# Patient Record
Sex: Female | Born: 1945 | Race: Black or African American | Hispanic: No | Marital: Married | Smoking: Current every day smoker
Health system: Southern US, Community
[De-identification: ages and names within clinical notes are randomized; demographics above are authoritative.]

## PROBLEM LIST (undated history)

## (undated) DIAGNOSIS — E876 Hypokalemia: Secondary | ICD-10-CM

## (undated) DIAGNOSIS — R739 Hyperglycemia, unspecified: Secondary | ICD-10-CM

## (undated) DIAGNOSIS — Z720 Tobacco use: Secondary | ICD-10-CM

## (undated) DIAGNOSIS — E039 Hypothyroidism, unspecified: Secondary | ICD-10-CM

## (undated) DIAGNOSIS — I10 Essential (primary) hypertension: Secondary | ICD-10-CM

## (undated) DIAGNOSIS — Z8679 Personal history of other diseases of the circulatory system: Secondary | ICD-10-CM

## (undated) DIAGNOSIS — J449 Chronic obstructive pulmonary disease, unspecified: Secondary | ICD-10-CM

## (undated) DIAGNOSIS — E785 Hyperlipidemia, unspecified: Secondary | ICD-10-CM

## (undated) HISTORY — PX: ABDOMINAL HYSTERECTOMY: SHX81

## (undated) HISTORY — PX: COLONOSCOPY: SHX174

---

## 2004-06-13 ENCOUNTER — Ambulatory Visit: Payer: Self-pay | Admitting: Internal Medicine

## 2005-07-24 ENCOUNTER — Ambulatory Visit: Payer: Self-pay | Admitting: Internal Medicine

## 2006-07-28 ENCOUNTER — Ambulatory Visit: Payer: Self-pay | Admitting: Internal Medicine

## 2007-09-01 ENCOUNTER — Ambulatory Visit: Payer: Self-pay | Admitting: Internal Medicine

## 2008-05-01 ENCOUNTER — Ambulatory Visit: Payer: Self-pay | Admitting: Gastroenterology

## 2008-09-13 ENCOUNTER — Ambulatory Visit: Payer: Self-pay | Admitting: Internal Medicine

## 2009-09-19 ENCOUNTER — Ambulatory Visit: Payer: Self-pay | Admitting: Internal Medicine

## 2010-11-26 ENCOUNTER — Ambulatory Visit: Payer: Self-pay | Admitting: Internal Medicine

## 2011-04-22 ENCOUNTER — Ambulatory Visit: Payer: Self-pay | Admitting: Internal Medicine

## 2011-12-17 ENCOUNTER — Ambulatory Visit: Payer: Self-pay | Admitting: Internal Medicine

## 2012-12-20 ENCOUNTER — Ambulatory Visit: Payer: Self-pay | Admitting: Internal Medicine

## 2013-05-22 ENCOUNTER — Inpatient Hospital Stay: Payer: Self-pay | Admitting: Internal Medicine

## 2013-05-22 LAB — CBC
HCT: 42.1 % (ref 35.0–47.0)
HGB: 14.2 g/dL (ref 12.0–16.0)
MCH: 29.4 pg (ref 26.0–34.0)
MCHC: 33.8 g/dL (ref 32.0–36.0)
MCV: 87 fL (ref 80–100)
PLATELETS: 188 10*3/uL (ref 150–440)
RBC: 4.84 10*6/uL (ref 3.80–5.20)
RDW: 15.1 % — ABNORMAL HIGH (ref 11.5–14.5)
WBC: 5.6 10*3/uL (ref 3.6–11.0)

## 2013-05-22 LAB — COMPREHENSIVE METABOLIC PANEL
ALBUMIN: 4.1 g/dL (ref 3.4–5.0)
ALT: 23 U/L (ref 12–78)
Alkaline Phosphatase: 108 U/L
Anion Gap: 5 — ABNORMAL LOW (ref 7–16)
BILIRUBIN TOTAL: 0.4 mg/dL (ref 0.2–1.0)
BUN: 9 mg/dL (ref 7–18)
Calcium, Total: 9.3 mg/dL (ref 8.5–10.1)
Chloride: 106 mmol/L (ref 98–107)
Co2: 32 mmol/L (ref 21–32)
Creatinine: 0.77 mg/dL (ref 0.60–1.30)
GLUCOSE: 138 mg/dL — AB (ref 65–99)
OSMOLALITY: 286 (ref 275–301)
Potassium: 2.9 mmol/L — ABNORMAL LOW (ref 3.5–5.1)
SGOT(AST): 15 U/L (ref 15–37)
Sodium: 143 mmol/L (ref 136–145)
Total Protein: 7.6 g/dL (ref 6.4–8.2)

## 2013-05-22 LAB — MAGNESIUM: Magnesium: 1.6 mg/dL — ABNORMAL LOW

## 2013-05-22 LAB — URINALYSIS, COMPLETE
BILIRUBIN, UR: NEGATIVE
Bacteria: NONE SEEN
Blood: NEGATIVE
Glucose,UR: NEGATIVE mg/dL (ref 0–75)
KETONE: NEGATIVE
Leukocyte Esterase: NEGATIVE
Nitrite: NEGATIVE
Ph: 7 (ref 4.5–8.0)
Protein: NEGATIVE
Specific Gravity: 1.004 (ref 1.003–1.030)

## 2013-05-22 LAB — CK TOTAL AND CKMB (NOT AT ARMC)
CK, Total: 94 U/L (ref 21–215)
CK, Total: 85 U/L (ref 21–215)
CK, Total: 86 U/L (ref 21–215)
CK-MB: 1.3 ng/mL (ref 0.5–3.6)
CK-MB: 2.3 ng/mL (ref 0.5–3.6)
CK-MB: 2.6 ng/mL (ref 0.5–3.6)

## 2013-05-22 LAB — TSH: Thyroid Stimulating Horm: 5.7 u[IU]/mL — ABNORMAL HIGH

## 2013-05-22 LAB — TROPONIN I
TROPONIN-I: 0.03 ng/mL
Troponin-I: 0.06 ng/mL — ABNORMAL HIGH
Troponin-I: 0.04 ng/mL

## 2013-05-22 LAB — LIPASE, BLOOD: Lipase: 215 U/L (ref 73–393)

## 2013-05-23 LAB — CBC WITH DIFFERENTIAL/PLATELET
BASOS PCT: 1.1 %
Basophil #: 0.1 10*3/uL (ref 0.0–0.1)
EOS ABS: 0.2 10*3/uL (ref 0.0–0.7)
Eosinophil %: 4.2 %
HCT: 37.4 % (ref 35.0–47.0)
HGB: 12.7 g/dL (ref 12.0–16.0)
LYMPHS ABS: 1.6 10*3/uL (ref 1.0–3.6)
Lymphocyte %: 30.7 %
MCH: 29.9 pg (ref 26.0–34.0)
MCHC: 33.9 g/dL (ref 32.0–36.0)
MCV: 88 fL (ref 80–100)
MONO ABS: 0.5 x10 3/mm (ref 0.2–0.9)
MONOS PCT: 9 %
NEUTROS PCT: 55 %
Neutrophil #: 2.8 10*3/uL (ref 1.4–6.5)
Platelet: 178 10*3/uL (ref 150–440)
RBC: 4.25 10*6/uL (ref 3.80–5.20)
RDW: 15.5 % — AB (ref 11.5–14.5)
WBC: 5.1 10*3/uL (ref 3.6–11.0)

## 2013-05-23 LAB — BASIC METABOLIC PANEL
ANION GAP: 4 — AB (ref 7–16)
BUN: 6 mg/dL — ABNORMAL LOW (ref 7–18)
Calcium, Total: 9 mg/dL (ref 8.5–10.1)
Chloride: 109 mmol/L — ABNORMAL HIGH (ref 98–107)
Co2: 24 mmol/L (ref 21–32)
Creatinine: 0.67 mg/dL (ref 0.60–1.30)
EGFR (African American): >60
GLUCOSE: 111 mg/dL — AB (ref 65–99)
OSMOLALITY: 272 (ref 275–301)
Potassium: 3.7 mmol/L (ref 3.5–5.1)
Sodium: 137 mmol/L (ref 136–145)

## 2013-05-23 LAB — MAGNESIUM: Magnesium: 2 mg/dL

## 2013-06-11 ENCOUNTER — Ambulatory Visit: Payer: Self-pay | Admitting: Physician Assistant

## 2014-01-05 ENCOUNTER — Ambulatory Visit: Payer: Self-pay | Admitting: Internal Medicine

## 2014-09-09 NOTE — Consult Note (Signed)
Brief Consult Note: Diagnosis: recurrent transient afib with rvr; hypokalemia and hypomagnasemia.   Patient was seen by consultant.   Recommend further assessment or treatment.   Comments: 69 yo female with history of hypertension and paroxysmal afib in the pastg admitted with afib with rvr. COnverted to nsr with iv cardizem. Had K of 2.9 and Mg of 1.6. Pt has had afib in the past when her K and Mg were low. She had a trivial troponin elevaton with her aifb. Denies chest pain. EKG does not suggest ischemia and is likley demand. Would repleat K and Mg to 4 and 2 respectively and continue with po cardizem. Transfer to floor and consider discharge when K and Mg are repleated and if rhythm remains in nsr. CHADSS scxore is 1. Would continue with asa only.  Electronic Signatures: Dalia HeadingFath, Johnanna Bakke A (MD)  (Signed 04-Jan-15 09:46)  Authored: Brief Consult Note   Last Updated: 04-Jan-15 09:46 by Dalia HeadingFath, Aarion Metzgar A (MD)

## 2014-09-09 NOTE — H&P (Signed)
PATIENT NAME:  Donna Figueroa, Donna Figueroa MR#:  [id number] DATE OF BIRTH:  1946/02/28  DATE OF ADMISSION:  05/22/2013  REFERRING PHYSICIAN: Eartha Inch. York Cerise, MD PRIMARY CARE PHYSICIAN: Dr. [name].   CARDIOLOGIST: None.   CHIEF COMPLAINT: Palpitations.   HISTORY OF PRESENT ILLNESS: This is a 69 year old female with significant past medical history of transient atrial fibrillation, COPD, tobacco abuse, hypothyroidism, hypertension, hyperlipidemia, who presents with complaints of palpitations. The patient reports she palpitations this a.m., which prompted her to come to the ED. She denies any chest pain, any shortness of breath, any fever, any chills, any cough, any dysuria or polyuria. Upon presentation the patient was found to be in atrial fibrillation with heart rate in the 150s. The patient was giving IV Cardizem x 2 with only heart rate minimally decreasing, so she was started on Cardizem drip. The patient's EKG did show atrial fibrillation with heart rate in 151. The patient had negative troponins. EKG did show her to be in atrial fibrillation with RVR at 151 beats per minute. The patient reports she   have history of atrial fibrillation in the past but reports the last episode was almost 10 years ago.   PAST MEDICAL HISTORY: 1. History of transient atrial fibrillation.  2. Chronic obstructive pulmonary disease.  3. Tobacco abuse.  4. Hypothyroidism.  5. Hypertension.  6. Hyperlipidemia.   PAST SURGICAL HISTORY:  Status post partial hysterectomy.   SOCIAL HISTORY: The patient smokes 1/2 pack per day. No history of alcohol use. Lives at home with her husband.   FAMILY HISTORY: Denies any family history of coronary artery disease.    HOME MEDICATIONS: 1. Synthroid 100 mcg oral daily.  2. Lipitor 40 mg at bedtime.  3. Klor-Con 20 mEq oral daily.  4.cardizem  extended release 240 mg oral daily.  5. Metoprolol 25 mg oral twice a day.  6. Aspirin 81 mg oral daily.   REVIEW OF SYSTEMS:   CONSTITUTIONAL: The patient denies fever, chills, fatigue, weakness, weight gain,, weight loss.  EYES: Denies blurry vision, double vision, inflammation, glaucoma.  ENT: Denies tinnitus, ear pain, hearing loss, epistaxis or discharge.  RESPIRATORY: Denies any cough, wheezing, hemoptysis, dyspnea, painful respiratory.  CARDIOVASCULAR: Denies any chest pain, orthopnea, edema, syncope. Reports palpitation.  GASTROINTESTINAL: Denies nausea, vomiting, diarrhea, abdominal pain, hematemesis.  GENITOURINARY: Denies dysuria, hematuria, renal colic.  ENDOCRINE: Denies polyuria, polydipsia, heat or cold intolerance.  HEMATOLOGY: Denies anemia, easy bruising, bleeding diathesis.  INTEGUMENTARY: Denies acne, rash or skin lesions.  MUSCULOSKELETAL: Denies any neck pain, back pain, swelling, gout or cramps. NEUROLOGICAL:  Denies CVA, TIA, headache, dementia, ataxia.  PSYCHIATRIC: Denies anxiety, insomnia or schizophrenia.   PHYSICAL EXAMINATION: VITAL SIGNS: Pulse 120, respiratory rate 20, blood pressure 102/75, saturating 97% on room air.  GENERAL: Well-nourished female, looks comfortable in bed in no apparent distress.  HEENT: Head atraumatic, normocephalic. Pupils equal, reactive to light. Pink conjunctivae. Anicteric sclerae. Moist oral mucosa.  NECK: Supple. No thyromegaly. No JVD. No carotid bruits.  CHEST: Good air entry bilaterally. No wheezing, rales, rhonchi.  CARDIOVASCULAR: S1, S2 heard. No rubs, murmur or gallops, irregular irregular, tachycardic.  ABDOMEN: Soft, nontender, nondistended. Bowel sounds present.  EXTREMITIES: No edema. No clubbing. No cyanosis. Pedal pulses +2 bilaterally.   PSYCHIATRIC: Appropriate affect. Awake, alert x 3. Intact judgment and insight.  NEUROLOGIC: Grossly intact. Motor five out of five. No focal deficits.  SKIN: Normal skin turgor. Warm and dry.  LYMPHATICS: No cervical lymphadenopathy could be appreciated.   PERTINENT  LABORATORY DATA: Glucose 138 ,  creatinine 0.77, sodium 143, potassium 2.9, chloride 106, CO2 32. ALT 23, AST 15, alkaline phosphatase 108. Troponin 0.03. White blood cell 5.6, hemoglobin 14.2, hematocrit 42.1, platelets 188. Urinalysis negative for leukocyte esterase. EKG showing atrial fibrillation and RVR with heart rate of 151.   ASSESSMENT AND PLAN: 1. Atrial fibrillation with rapid ventricular response. The patient is known to have history of paroxysmal atrial fibrillation. Heart rate was uncontrolled with IV Cardizem pushes, so she will need to be started on Cardizem drip and will be admitted to Critical Care Unit. Denies any chest pain, any shortness of breath, has negative troponins. We will continue to cycle her cardiac enzymes. We will check 2-D echo and will consult cardiology service. As well, continue her on her metoprolol and p.o. Cardizem. The patient is already on aspirin. We will check TSH.  2. Hypokalemia. We will replace.  3. Hypomagnesemia: We will replace. Will recheck in a.m.  4. Tobacco abuse: The patient was counseled.  5. History of chronic obstructive pulmonary disease. The patient has no active wheezing.  6. Hypothyroidism. Continue with Synthroid.  7. Hypertension: Blood pressure acceptable. Continue with beta blockers and Cardizem.  8. Hyperlipidemia. Continue with statin.  9. Deep vein thrombosis prophylaxis. Subcutaneous heparin.   TOTAL TIME SPENT ON ADMISSION AND PATIENT CARE: 55 minutes.    ____________________________ Starleen Armsawood S. Corianna Avallone, MD dse:sg D: 05/22/2013 06:46:00 ET T: 05/22/2013 07:03:00 ET JOB#: [id number]  cc: Starleen Armsawood S. Sandeep Delagarza, MD, <Dictator> Zamier Eggebrecht Teena IraniS Airon Sahni MD ELECTRONICALLY SIGNED 05/24/2013 0:02

## 2015-08-06 ENCOUNTER — Other Ambulatory Visit: Payer: Self-pay | Admitting: Internal Medicine

## 2015-08-06 DIAGNOSIS — Z1231 Encounter for screening mammogram for malignant neoplasm of breast: Secondary | ICD-10-CM

## 2015-08-13 ENCOUNTER — Ambulatory Visit
Admission: RE | Admit: 2015-08-13 | Discharge: 2015-08-13 | Disposition: A | Discharge status: Home / Self Care | Payer: Medicare Other | Source: Ambulatory Visit | Attending: Internal Medicine | Admitting: Internal Medicine

## 2015-08-13 ENCOUNTER — Other Ambulatory Visit: Payer: Self-pay | Admitting: Internal Medicine

## 2015-08-13 DIAGNOSIS — Z1231 Encounter for screening mammogram for malignant neoplasm of breast: Secondary | ICD-10-CM | POA: Insufficient documentation

## 2017-07-02 ENCOUNTER — Other Ambulatory Visit: Payer: Self-pay | Admitting: Internal Medicine

## 2017-07-02 DIAGNOSIS — Z1231 Encounter for screening mammogram for malignant neoplasm of breast: Secondary | ICD-10-CM

## 2017-08-13 ENCOUNTER — Ambulatory Visit
Admission: RE | Admit: 2017-08-13 | Discharge: 2017-08-13 | Disposition: A | Discharge status: Home / Self Care | Payer: Medicare Other | Source: Ambulatory Visit | Attending: Internal Medicine | Admitting: Internal Medicine

## 2017-08-13 DIAGNOSIS — Z1231 Encounter for screening mammogram for malignant neoplasm of breast: Secondary | ICD-10-CM | POA: Insufficient documentation

## 2018-01-26 ENCOUNTER — Other Ambulatory Visit: Payer: Self-pay | Admitting: Otolaryngology

## 2018-01-26 DIAGNOSIS — H903 Sensorineural hearing loss, bilateral: Secondary | ICD-10-CM

## 2018-02-05 ENCOUNTER — Ambulatory Visit
Admission: RE | Admit: 2018-02-05 | Discharge: 2018-02-05 | Disposition: A | Discharge status: Home / Self Care | Payer: Medicare Other | Source: Ambulatory Visit | Attending: Otolaryngology | Admitting: Otolaryngology

## 2018-02-05 ENCOUNTER — Other Ambulatory Visit
Admission: RE | Admit: 2018-02-05 | Discharge: 2018-02-05 | Disposition: A | Discharge status: Home / Self Care | Payer: Medicare Other | Source: Ambulatory Visit | Attending: Otolaryngology | Admitting: Otolaryngology

## 2018-02-05 ENCOUNTER — Encounter (INDEPENDENT_AMBULATORY_CARE_PROVIDER_SITE_OTHER): Payer: Self-pay

## 2018-02-05 DIAGNOSIS — H903 Sensorineural hearing loss, bilateral: Secondary | ICD-10-CM

## 2018-02-05 MED ORDER — GADOBENATE DIMEGLUMINE 529 MG/ML IV SOLN
15.0000 mL | Freq: Once | INTRAVENOUS | Status: AC | PRN
  Administered 2018-02-05: 15 mL via INTRAVENOUS

## 2019-04-05 ENCOUNTER — Other Ambulatory Visit: Payer: Self-pay | Admitting: Internal Medicine

## 2019-04-05 DIAGNOSIS — Z1231 Encounter for screening mammogram for malignant neoplasm of breast: Secondary | ICD-10-CM

## 2019-08-25 ENCOUNTER — Ambulatory Visit
Admission: RE | Admit: 2019-08-25 | Discharge: 2019-08-25 | Disposition: A | Discharge status: Home / Self Care | Payer: Medicare Other | Source: Ambulatory Visit | Attending: Internal Medicine | Admitting: Internal Medicine

## 2019-08-25 DIAGNOSIS — Z1231 Encounter for screening mammogram for malignant neoplasm of breast: Secondary | ICD-10-CM | POA: Diagnosis not present

## 2019-10-10 ENCOUNTER — Other Ambulatory Visit
Admission: RE | Admit: 2019-10-10 | Discharge: 2019-10-10 | Disposition: A | Discharge status: Home / Self Care | Payer: Medicare Other | Source: Ambulatory Visit | Attending: Internal Medicine | Admitting: Internal Medicine

## 2019-10-10 ENCOUNTER — Other Ambulatory Visit: Payer: Self-pay

## 2019-10-10 DIAGNOSIS — Z01812 Encounter for preprocedural laboratory examination: Secondary | ICD-10-CM | POA: Diagnosis present

## 2019-10-10 DIAGNOSIS — Z20822 Contact with and (suspected) exposure to covid-19: Secondary | ICD-10-CM | POA: Diagnosis not present

## 2019-10-10 LAB — SARS CORONAVIRUS 2 (TAT 6-24 HRS): SARS Coronavirus 2: NEGATIVE

## 2019-10-11 ENCOUNTER — Encounter: Payer: Self-pay | Admitting: Internal Medicine

## 2019-10-12 ENCOUNTER — Encounter
Admission: RE | Disposition: A | Discharge status: Home / Self Care | Payer: Self-pay | Source: Home / Self Care | Attending: Internal Medicine

## 2019-10-12 ENCOUNTER — Ambulatory Visit: Payer: Medicare Other | Admitting: Anesthesiology

## 2019-10-12 ENCOUNTER — Ambulatory Visit
Admission: RE | Admit: 2019-10-12 | Discharge: 2019-10-12 | Disposition: A | Discharge status: Home / Self Care | Payer: Medicare Other | Attending: Internal Medicine | Admitting: Internal Medicine

## 2019-10-12 ENCOUNTER — Encounter: Payer: Self-pay | Admitting: Internal Medicine

## 2019-10-12 DIAGNOSIS — I10 Essential (primary) hypertension: Secondary | ICD-10-CM | POA: Diagnosis not present

## 2019-10-12 DIAGNOSIS — Z79890 Hormone replacement therapy (postmenopausal): Secondary | ICD-10-CM | POA: Diagnosis not present

## 2019-10-12 DIAGNOSIS — E876 Hypokalemia: Secondary | ICD-10-CM | POA: Insufficient documentation

## 2019-10-12 DIAGNOSIS — J449 Chronic obstructive pulmonary disease, unspecified: Secondary | ICD-10-CM | POA: Insufficient documentation

## 2019-10-12 DIAGNOSIS — I4891 Unspecified atrial fibrillation: Secondary | ICD-10-CM | POA: Insufficient documentation

## 2019-10-12 DIAGNOSIS — E785 Hyperlipidemia, unspecified: Secondary | ICD-10-CM | POA: Diagnosis not present

## 2019-10-12 DIAGNOSIS — K640 First degree hemorrhoids: Secondary | ICD-10-CM | POA: Insufficient documentation

## 2019-10-12 DIAGNOSIS — Z1211 Encounter for screening for malignant neoplasm of colon: Secondary | ICD-10-CM | POA: Diagnosis not present

## 2019-10-12 DIAGNOSIS — E039 Hypothyroidism, unspecified: Secondary | ICD-10-CM | POA: Insufficient documentation

## 2019-10-12 DIAGNOSIS — K5730 Diverticulosis of large intestine without perforation or abscess without bleeding: Secondary | ICD-10-CM | POA: Diagnosis not present

## 2019-10-12 DIAGNOSIS — Z79899 Other long term (current) drug therapy: Secondary | ICD-10-CM | POA: Diagnosis not present

## 2019-10-12 HISTORY — DX: Essential (primary) hypertension: I10

## 2019-10-12 HISTORY — PX: COLONOSCOPY WITH PROPOFOL: SHX5780

## 2019-10-12 HISTORY — DX: Tobacco use: Z72.0

## 2019-10-12 HISTORY — DX: Hyperlipidemia, unspecified: E78.5

## 2019-10-12 HISTORY — DX: Hypothyroidism, unspecified: E03.9

## 2019-10-12 HISTORY — DX: Personal history of other diseases of the circulatory system: Z86.79

## 2019-10-12 HISTORY — DX: Chronic obstructive pulmonary disease, unspecified: J44.9

## 2019-10-12 HISTORY — DX: Hyperglycemia, unspecified: R73.9

## 2019-10-12 HISTORY — DX: Hypokalemia: E87.6

## 2019-10-12 SURGERY — COLONOSCOPY WITH PROPOFOL
Anesthesia: General

## 2019-10-12 MED ORDER — SODIUM CHLORIDE 0.9 % IV SOLN: INTRAVENOUS | Status: DC

## 2019-10-12 MED ORDER — PROPOFOL 500 MG/50ML IV EMUL
INTRAVENOUS | Status: DC | PRN
  Administered 2019-10-12: 100 ug/kg/min via INTRAVENOUS

## 2019-10-12 MED ORDER — LIDOCAINE HCL (CARDIAC) PF 100 MG/5ML IV SOSY
PREFILLED_SYRINGE | INTRAVENOUS | Status: DC | PRN
  Administered 2019-10-12: 100 mg via INTRAVENOUS

## 2019-10-12 MED ORDER — PROPOFOL 10 MG/ML IV BOLUS
INTRAVENOUS | Status: DC | PRN
  Administered 2019-10-12: 20 mg via INTRAVENOUS
  Administered 2019-10-12: 50 mg via INTRAVENOUS
  Administered 2019-10-12: 20 mg via INTRAVENOUS

## 2019-10-12 NOTE — H&P (Signed)
Outpatient short stay form Pre-procedure 10/12/2019 10:04 AM Donna Figueroa Donna Figueroa, M.D.  Primary Physician: [name], M.D.  Reason for visit: Colon cancer screening  History of present illness:  Patient presents for colonoscopy for colon cancer screening. The patient denies complaints of abdominal pain, significant change in bowel habits, or rectal bleeding.     No current facility-administered medications for this encounter.  Medications Prior to Admission  Medication Sig Dispense Refill Last Dose  . atorvastatin (LIPITOR) 40 MG tablet Take 40 mg by mouth daily.     [name] diltiazem (CARDIZEM CD) 240 MG 24 hr capsule Take 240 mg by mouth daily.     [name] levothyroxine (SYNTHROID) 100 MCG tablet Take 100 mcg by mouth daily before breakfast.     . losartan (COZAAR) 100 MG tablet Take 100 mg by mouth daily.     . metoprolol tartrate (LOPRESSOR) 25 MG tablet Take 25 mg by mouth 2 (two) times daily.     . pantoprazole (PROTONIX) 40 MG tablet Take 40 mg by mouth daily.     . potassium chloride SA (KLOR-CON) 20 MEQ tablet Take 10 mEq by mouth 3 (three) times daily.        Allergies  Allergen Reactions  . Imdur [Isosorbide Nitrate] Other (See Comments)    Headache      Past Medical History:  Diagnosis Date  . COPD (chronic obstructive pulmonary disease) (HCC)   . History of atrial fibrillation   . Hyperglycemia   . Hyperlipidemia   . Hypertension   . Hypokalemia   . Hypothyroidism   . Tobacco abuse     Review of systems:  Otherwise negative.    Physical Exam  Gen: Alert, oriented. Appears stated age.  HEENT: [name]. PERRLA. Lungs: CTA, no wheezes. CV: RR nl S1, S2. Abd: soft, benign, no masses. BS+ Ext: No edema. Pulses 2+    Planned procedures: Proceed with colonoscopy. The patient understands the nature of the planned procedure, indications, risks, alternatives and potential complications including but not limited to bleeding, infection, perforation, damage to internal  organs and possible oversedation/side effects from anesthesia. The patient agrees and gives consent to proceed.  Please refer to procedure notes for findings, recommendations and patient disposition/instructions.     Donna Figueroa Donna Figueroa, M.D. Gastroenterology 10/12/2019  10:04 AM

## 2019-10-12 NOTE — Anesthesia Preprocedure Evaluation (Signed)
Anesthesia Evaluation  Patient identified by MRN, date of birth, ID band Patient awake    Reviewed: Allergy & Precautions, NPO status , Patient's Chart, lab work & pertinent test results  History of Anesthesia Complications Negative for: history of anesthetic complications  Airway Mallampati: II  TM Distance: >3 FB Neck ROM: Full    Dental no notable dental hx.    Pulmonary COPD, Current SmokerPatient did not abstain from smoking.,    breath sounds clear to auscultation- rhonchi (-) wheezing      Cardiovascular hypertension, Pt. on medications (-) CAD, (-) Past MI, (-) Cardiac Stents and (-) CABG + dysrhythmias Atrial Fibrillation  Rhythm:Regular Rate:Normal - Systolic murmurs and - Diastolic murmurs    Neuro/Psych neg Seizures negative neurological ROS  negative psych ROS   GI/Hepatic negative GI ROS, Neg liver ROS,   Endo/Other  neg diabetesHypothyroidism   Renal/GU negative Renal ROS     Musculoskeletal negative musculoskeletal ROS (+)   Abdominal (+) - obese,   Peds  Hematology negative hematology ROS (+)   Anesthesia Other Findings Past Medical History: No date: COPD (chronic obstructive pulmonary disease) (HCC) No date: History of atrial fibrillation No date: Hyperglycemia No date: Hyperlipidemia No date: Hypertension No date: Hypokalemia No date: Hypothyroidism No date: Tobacco abuse   Reproductive/Obstetrics                             Anesthesia Physical Anesthesia Plan  ASA: III  Anesthesia Plan: General   Post-op Pain Management:    Induction: Intravenous  PONV Risk Score and Plan: 1 and Propofol infusion  Airway Management Planned: Natural Airway  Additional Equipment:   Intra-op Plan:   Post-operative Plan:   Informed Consent: I have reviewed the patients History and Physical, chart, labs and discussed the procedure including the risks, benefits and  alternatives for the proposed anesthesia with the patient or authorized representative who has indicated his/her understanding and acceptance.     Dental advisory given  Plan Discussed with: CRNA and Anesthesiologist  Anesthesia Plan Comments:         Anesthesia Quick Evaluation

## 2019-10-12 NOTE — Transfer of Care (Signed)
Immediate Anesthesia Transfer of Care Note  Patient: Donna Figueroa  Procedure(s) Performed: COLONOSCOPY WITH PROPOFOL (N/A )  Patient Location: PACU and Endoscopy Unit  Anesthesia Type:General  Level of Consciousness: awake, oriented and patient cooperative  Airway & Oxygen Therapy: Patient Spontanous Breathing  Post-op Assessment: Report given to RN, Post -op Vital signs reviewed and stable and Patient moving all extremities  Post vital signs: Reviewed and stable  Last Vitals:  Vitals Value Taken Time  BP 122/72 10/12/19 1103  Temp 36.1 C 10/12/19 1103  Pulse 64 10/12/19 1104  Resp 17 10/12/19 1104  SpO2 99 % 10/12/19 1104  Vitals shown include unvalidated device data.  Last Pain:  Vitals:   10/12/19 1103  TempSrc: Temporal  PainSc: 0-No pain         Complications: No apparent anesthesia complications

## 2019-10-12 NOTE — Anesthesia Postprocedure Evaluation (Signed)
Anesthesia Post Note  Patient: Donna Figueroa  Procedure(s) Performed: COLONOSCOPY WITH PROPOFOL (N/A )  Patient location during evaluation: Endoscopy Anesthesia Type: General Level of consciousness: awake and alert and oriented Pain management: pain level controlled Vital Signs Assessment: post-procedure vital signs reviewed and stable Respiratory status: spontaneous breathing, nonlabored ventilation and respiratory function stable Cardiovascular status: blood pressure returned to baseline and stable Postop Assessment: no signs of nausea or vomiting Anesthetic complications: no     Last Vitals:  Vitals:   10/12/19 1011 10/12/19 1103  BP: (!) 164/72 122/72  Pulse: (!) 59   Resp: 16   Temp: 36.4 C (!) 36.1 C  SpO2: 97% 98%    Last Pain:  Vitals:   10/12/19 1123  TempSrc:   PainSc: 0-No pain                 Amy Penwarden

## 2019-10-12 NOTE — Op Note (Signed)
[facility] Gastroenterology Patient Name: Donna Figueroa Procedure Date: 10/12/2019 10:32 AM MRN: [id number] Account #: [id number] Date of Birth: 1946/02/13 Admit Type: Outpatient Age: 74 Room: [facility] ENDO ROOM 3 Gender: Female Note Status: Finalized Procedure:             Colonoscopy Indications:           Screening for colorectal malignant neoplasm Providers:             Boykin Nearing. Autry Droege MD, MD Medicines:             Propofol per Anesthesia Complications:         No immediate complications. Procedure:             Pre-Anesthesia Assessment:                        - The risks and benefits of the procedure and the                         sedation options and risks were discussed with the                         patient. All questions were answered and informed                         consent was obtained.                        - Patient identification and proposed procedure were                         verified prior to the procedure by the nurse. The                         procedure was verified in the procedure room.                        - ASA Grade Assessment: III - A patient with severe                         systemic disease.                        - After reviewing the risks and benefits, the patient                         was deemed in satisfactory condition to undergo the                         procedure.                        After obtaining informed consent, the colonoscope was                         passed under direct vision. Throughout the procedure,                         the patient's blood pressure, pulse, and oxygen  saturations were monitored continuously. The                         Colonoscope was introduced through the anus and                         advanced to the the cecum, identified by appendiceal                         orifice and ileocecal valve. The colonoscopy was                         performed  without difficulty. The patient tolerated                         the procedure well. The quality of the bowel                         preparation was good. The ileocecal valve, appendiceal                         orifice, and rectum were photographed. Findings:      The perianal and digital rectal examinations were normal. Pertinent       negatives include normal sphincter tone and no palpable rectal lesions.      Multiple small and large-mouthed diverticula were found in the sigmoid       colon. There was no evidence of diverticular bleeding.      A few medium-mouthed diverticula were found in the ascending colon and       cecum. There was no evidence of diverticular bleeding.      Non-bleeding internal hemorrhoids were found during retroflexion. The       hemorrhoids were Grade I (internal hemorrhoids that do not prolapse).      The exam was otherwise without abnormality. Impression:            - Moderate diverticulosis in the sigmoid colon. There                         was no evidence of diverticular bleeding.                        - Mild diverticulosis in the ascending colon and in                         the cecum. There was no evidence of diverticular                         bleeding.                        - Non-bleeding internal hemorrhoids.                        - The examination was otherwise normal.                        - No specimens collected. Recommendation:        - Patient has a contact number available for  emergencies. The signs and symptoms of potential                         delayed complications were discussed with the patient.                         Return to normal activities tomorrow. Written                         discharge instructions were provided to the patient.                        - Resume previous diet.                        - Continue present medications.                        - No repeat colonoscopy due to current age  (56 years                         or older) and the absence of colonic polyps.                        - Return to GI office PRN.                        - The findings and recommendations were discussed with                         the patient. Procedure Code(s):     --- Professional ---                        D9242, Colorectal cancer screening; colonoscopy on                         individual not meeting criteria for high risk Diagnosis Code(s):     --- Professional ---                        K57.30, Diverticulosis of large intestine without                         perforation or abscess without bleeding                        K64.0, First degree hemorrhoids                        Z12.11, Encounter for screening for malignant neoplasm                         of colon CPT copyright 2019 American Medical Association. All rights reserved. The codes documented in this report are preliminary and upon coder review may  be revised to meet current compliance requirements. Efrain Sella MD, MD 10/12/2019 11:01:55 AM This report has been signed electronically. Number of Addenda: 0 Note Initiated On: 10/12/2019 10:32 AM Scope Withdrawal Time: 0 hours 4 minutes 44 seconds  Total Procedure Duration: 0 hours 7 minutes 51 seconds  Estimated Blood  Loss:  Estimated blood loss: none.      [facility]

## 2019-10-13 ENCOUNTER — Encounter: Payer: Self-pay | Admitting: *Deleted

## 2020-01-11 ENCOUNTER — Other Ambulatory Visit: Payer: Self-pay

## 2020-01-11 ENCOUNTER — Encounter: Payer: Self-pay | Admitting: Emergency Medicine

## 2020-01-11 ENCOUNTER — Ambulatory Visit
Admission: EM | Admit: 2020-01-11 | Discharge: 2020-01-11 | Disposition: A | Discharge status: Home / Self Care | Payer: Medicare Other | Attending: Family Medicine | Admitting: Family Medicine

## 2020-01-11 DIAGNOSIS — F17210 Nicotine dependence, cigarettes, uncomplicated: Secondary | ICD-10-CM | POA: Insufficient documentation

## 2020-01-11 DIAGNOSIS — U071 COVID-19: Secondary | ICD-10-CM | POA: Insufficient documentation

## 2020-01-11 DIAGNOSIS — Z79899 Other long term (current) drug therapy: Secondary | ICD-10-CM | POA: Diagnosis not present

## 2020-01-11 DIAGNOSIS — J449 Chronic obstructive pulmonary disease, unspecified: Secondary | ICD-10-CM | POA: Insufficient documentation

## 2020-01-11 DIAGNOSIS — R05 Cough: Secondary | ICD-10-CM | POA: Diagnosis not present

## 2020-01-11 DIAGNOSIS — I4891 Unspecified atrial fibrillation: Secondary | ICD-10-CM | POA: Insufficient documentation

## 2020-01-11 DIAGNOSIS — R059 Cough, unspecified: Secondary | ICD-10-CM

## 2020-01-11 DIAGNOSIS — I10 Essential (primary) hypertension: Secondary | ICD-10-CM | POA: Insufficient documentation

## 2020-01-11 DIAGNOSIS — Z8249 Family history of ischemic heart disease and other diseases of the circulatory system: Secondary | ICD-10-CM | POA: Insufficient documentation

## 2020-01-11 DIAGNOSIS — E039 Hypothyroidism, unspecified: Secondary | ICD-10-CM | POA: Diagnosis not present

## 2020-01-11 DIAGNOSIS — E785 Hyperlipidemia, unspecified: Secondary | ICD-10-CM | POA: Diagnosis not present

## 2020-01-11 LAB — SARS CORONAVIRUS 2 (TAT 6-24 HRS): SARS Coronavirus 2: POSITIVE — AB

## 2020-01-11 MED ORDER — BENZONATATE 200 MG PO CAPS: 200.0000 mg | ORAL_CAPSULE | Freq: Three times a day (TID) | ORAL | 0 refills | Status: AC | PRN

## 2020-01-11 NOTE — ED Provider Notes (Signed)
[facility]    CSN: [id number] Arrival date & time: 01/11/20  1028      History   Chief Complaint Chief Complaint  Patient presents with  . Cough    HPI  74 year old female with known COPD presents for Covid testing.  Patient reports that she has a cough.  Patient has ongoing cough but states that is hard to tell whether this is new or whether this is just from the fact that she smokes.  She was recently exposed to COVID-19.  She states that her brother-in-law visited and has tested positive COVID-19.  She desires testing today.  She has no other symptoms at this time.  Past Medical History:  Diagnosis Date  . COPD (chronic obstructive pulmonary disease) (HCC)   . History of atrial fibrillation   . Hyperglycemia   . Hyperlipidemia   . Hypertension   . Hypokalemia   . Hypothyroidism   . Tobacco abuse     Past Surgical History:  Procedure Laterality Date  . ABDOMINAL HYSTERECTOMY    . COLONOSCOPY    . COLONOSCOPY WITH PROPOFOL N/A 10/12/2019   Procedure: COLONOSCOPY WITH PROPOFOL;  Surgeon: Toledo, Boykin Nearing, MD;  Location: [facility] ENDOSCOPY;  Service: Gastroenterology;  Laterality: N/A;    OB History   No obstetric history on file.      Home Medications    Prior to Admission medications   Medication Sig Start Date End Date Taking? Authorizing Provider  atorvastatin (LIPITOR) 40 MG tablet Take 40 mg by mouth daily.   Yes [provider]  diltiazem (CARDIZEM CD) 240 MG 24 hr capsule Take 240 mg by mouth daily.   Yes [provider]  levothyroxine (SYNTHROID) 100 MCG tablet Take 100 mcg by mouth daily before breakfast.   Yes [provider]  losartan (COZAAR) 100 MG tablet Take 100 mg by mouth daily.   Yes [provider]  metoprolol tartrate (LOPRESSOR) 25 MG tablet Take 25 mg by mouth 2 (two) times daily.   Yes [provider]  pantoprazole (PROTONIX) 40 MG tablet Take 40 mg by mouth daily.   Yes [provider]  potassium chloride SA (KLOR-CON) 20 MEQ tablet Take 10 mEq by mouth 3 (three) times daily.   Yes [provider]  benzonatate (TESSALON) 200 MG capsule Take 1 capsule (200 mg total) by mouth 3 (three) times daily as needed for cough. 01/11/20   Tommie Sams, DO    Family History Family History  Problem Relation Age of Onset  . Heart attack Mother   . Heart attack Father   . Breast cancer Neg Hx     Social History Social History   Tobacco Use  . Smoking status: Current Every Day Smoker    Packs/day: 0.50  . Smokeless tobacco: Never Used  Substance Use Topics  . Alcohol use: Yes  . Drug use: Never     Allergies   Imdur [isosorbide nitrate]   Review of Systems Review of Systems  Constitutional: Negative for fever.  Respiratory: Positive for cough.    Physical Exam Triage Vital Signs ED Triage Vitals  Enc Vitals Group     BP 01/11/20 1052 (!) 168/76     Pulse Rate 01/11/20 1052 68     Resp 01/11/20 1052 18     Temp 01/11/20 1052 98.5 F (36.9 C)     Temp Source 01/11/20 1052 Oral     SpO2 01/11/20 1052 97 %     Weight  01/11/20 1050 160 lb 15 oz (73 kg)     Height 01/11/20 1050 5\' 5"  (1.651 m)     Head Circumference --      Peak Flow --      Pain Score 01/11/20 1050 0     Pain Loc --      Pain Edu? --      Excl. in GC? --    Updated Vital Signs BP (!) 168/76 (BP Location: Right Arm)   Pulse 68   Temp 98.5 F (36.9 C) (Oral)   Resp 18   Ht 5\' 5"  (1.651 m)   Wt 73 kg   SpO2 97%   BMI 26.78 kg/m   Visual Acuity Right Eye Distance:   Left Eye Distance:   Bilateral Distance:    Right Eye Near:   Left Eye Near:    Bilateral Near:     Physical Exam Vitals and nursing note reviewed.  Constitutional:      General: She is not in acute distress.    Appearance: Normal appearance. She is not ill-appearing.  HENT:     Head: Normocephalic and atraumatic.  Eyes:     General:        Right eye: No discharge.        Left eye: No  discharge.     Conjunctiva/sclera: Conjunctivae normal.  Cardiovascular:     Rate and Rhythm: Normal rate and regular rhythm.     Heart sounds: No murmur heard.   Pulmonary:     Effort: Pulmonary effort is normal.     Breath sounds: No wheezing.  Neurological:     Mental Status: She is alert.  Psychiatric:        Mood and Affect: Mood normal.        Behavior: Behavior normal.    UC Treatments / Results  Labs (all labs ordered are listed, but only abnormal results are displayed) Labs Reviewed  SARS CORONAVIRUS 2 (TAT 6-24 HRS)    EKG   Radiology No results found.  Procedures Procedures (including critical care time)  Medications Ordered in UC Medications - No data to display  Initial Impression / Assessment and Plan / UC Course  I have reviewed the triage vital signs and the nursing notes.  Pertinent labs & imaging results that were available during my care of the patient were reviewed by me and considered in my medical decision making (see chart for details).     74 year old female with COPD presents with cough.  She is well-appearing on exam.  Patient is essentially here for Covid testing.  Tessalon Perles for cough.  Final Clinical Impressions(s) / UC Diagnoses   Final diagnoses:  Cough     Discharge Instructions     Result will be back tomorrow.  Take care  Dr.    ED Prescriptions    Medication Sig Dispense Auth. Provider   benzonatate (TESSALON) 200 MG capsule Take 1 capsule (200 mg total) by mouth 3 (three) times daily as needed for cough. 30 capsule 61, DO     PDMP not reviewed this encounter.   Adriana Simas, Tommie Sams 01/11/20 1139

## 2020-01-11 NOTE — ED Triage Notes (Signed)
Patient here for COVID testing. She reports a cough that started last week. Denies any other symptoms.

## 2020-01-11 NOTE — Discharge Instructions (Signed)
Result will be back tomorrow.  Take care  Dr. Adriana Simas

## 2020-01-14 ENCOUNTER — Telehealth: Payer: Self-pay | Admitting: Unknown Physician Specialty

## 2020-01-14 NOTE — Telephone Encounter (Signed)
Called to Discuss with patient about Covid symptoms and the use of the monoclonal antibody infusion for those with mild to moderate Covid symptoms and at a high risk of hospitalization.     Pt appears to qualify for this infusion due to co-morbid conditions and/or a member of an at-risk group in accordance with the FDA Emergency Use Authorization.    Unable to reach pt   LMOM 

## 2020-08-24 ENCOUNTER — Other Ambulatory Visit: Payer: Self-pay | Admitting: Internal Medicine

## 2020-08-24 DIAGNOSIS — Z1231 Encounter for screening mammogram for malignant neoplasm of breast: Secondary | ICD-10-CM

## 2021-09-30 ENCOUNTER — Other Ambulatory Visit: Payer: Self-pay | Admitting: Internal Medicine

## 2021-09-30 DIAGNOSIS — Z1231 Encounter for screening mammogram for malignant neoplasm of breast: Secondary | ICD-10-CM

## 2021-10-31 ENCOUNTER — Ambulatory Visit
Admission: RE | Admit: 2021-10-31 | Discharge: 2021-10-31 | Disposition: A | Discharge status: Home / Self Care | Payer: Medicare Other | Source: Ambulatory Visit | Attending: Internal Medicine | Admitting: Internal Medicine

## 2021-10-31 DIAGNOSIS — Z1231 Encounter for screening mammogram for malignant neoplasm of breast: Secondary | ICD-10-CM | POA: Diagnosis present

## 2022-10-02 ENCOUNTER — Other Ambulatory Visit: Payer: Self-pay | Admitting: Internal Medicine

## 2022-10-02 DIAGNOSIS — Z1231 Encounter for screening mammogram for malignant neoplasm of breast: Secondary | ICD-10-CM

## 2022-11-12 ENCOUNTER — Ambulatory Visit
Admission: RE | Admit: 2022-11-12 | Discharge: 2022-11-12 | Disposition: A | Discharge status: Home / Self Care | Payer: Medicare Other | Source: Ambulatory Visit | Attending: Internal Medicine | Admitting: Internal Medicine

## 2022-11-12 DIAGNOSIS — Z1231 Encounter for screening mammogram for malignant neoplasm of breast: Secondary | ICD-10-CM | POA: Insufficient documentation

## 2022-12-03 IMAGING — MG MM DIGITAL SCREENING BILAT W/ TOMO AND CAD
6 of 10 series · 6 of 30 positions shown · non-contrast
Comparison: Previous exam(s).

CLINICAL DATA: Screening.

EXAM:
DIGITAL SCREENING BILATERAL MAMMOGRAM WITH TOMOSYNTHESIS AND CAD
TECHNIQUE: Bilateral screening digital craniocaudal and mediolateral oblique
mammograms were obtained. Bilateral screening digital breast
tomosynthesis was performed. The images were evaluated with
computer-aided detection.

[R CC synth-2D]
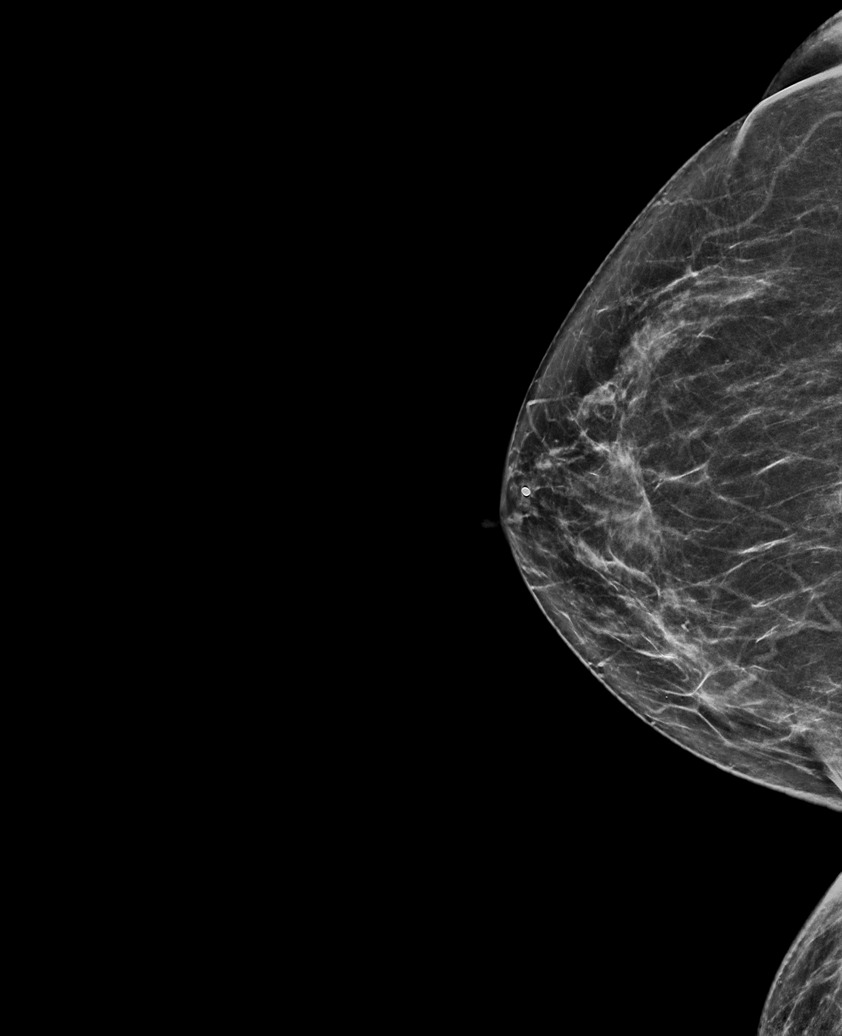

[R MLO synth-2D]
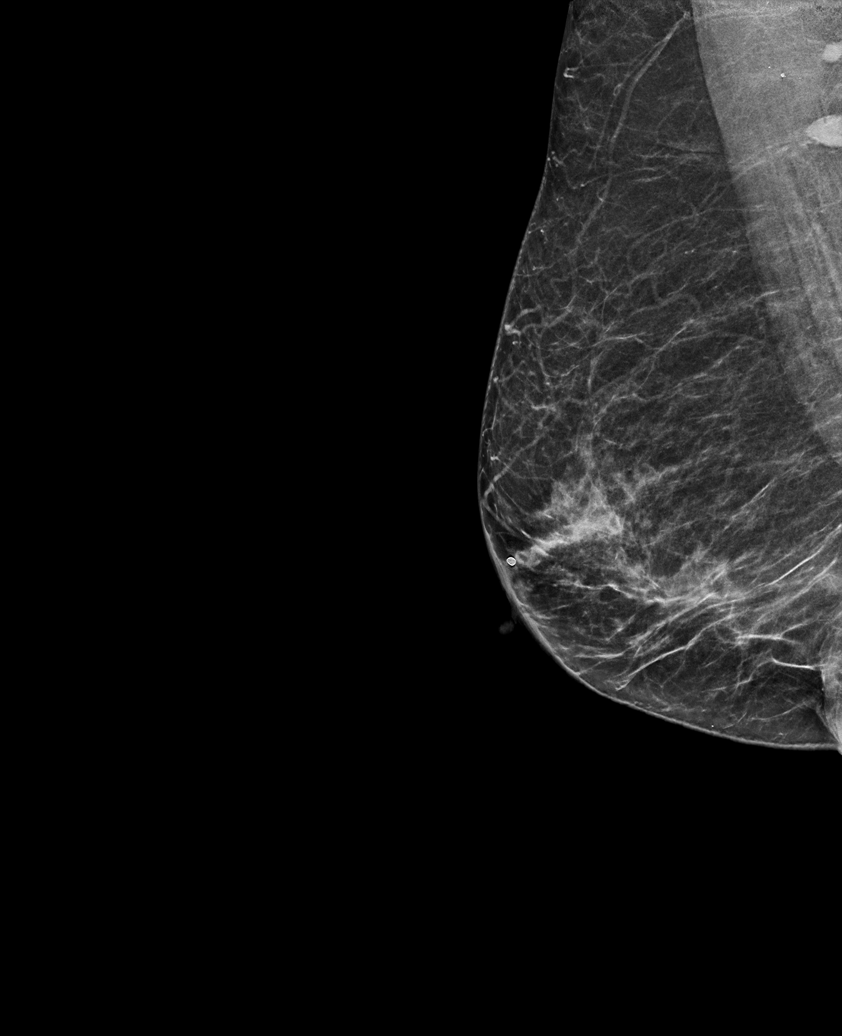

[L MLO synth-2D (1 of 2)]
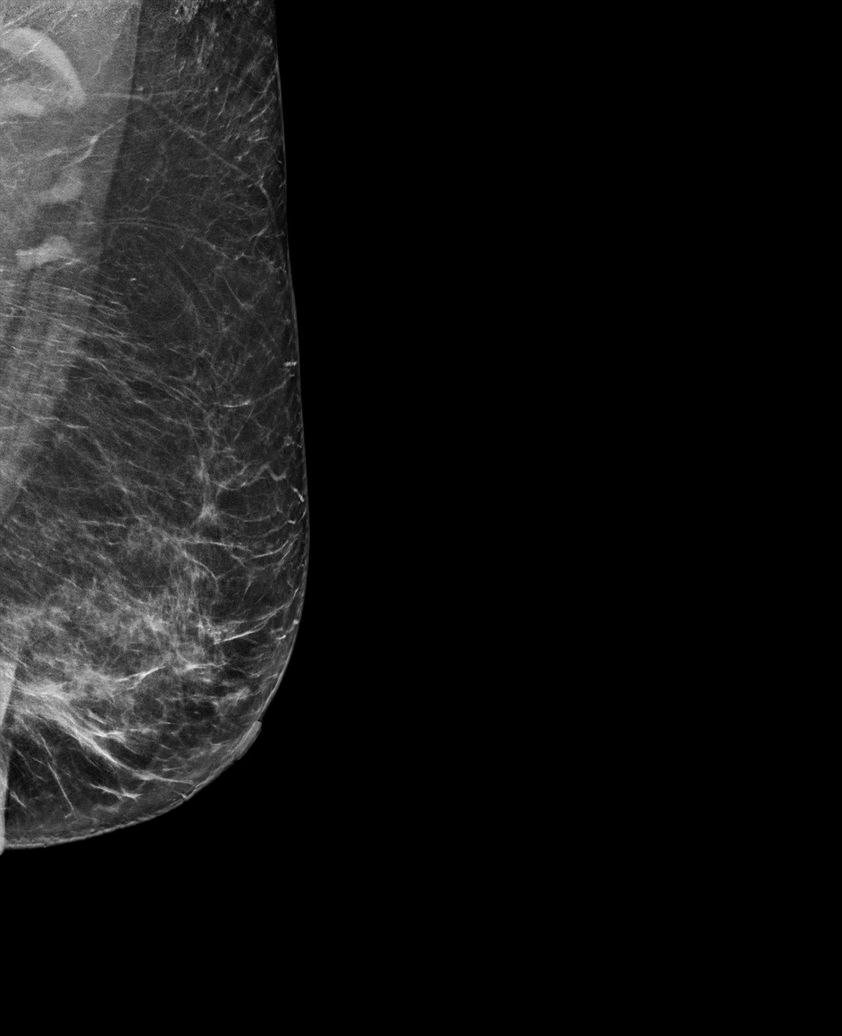

[L CC synth-2D]
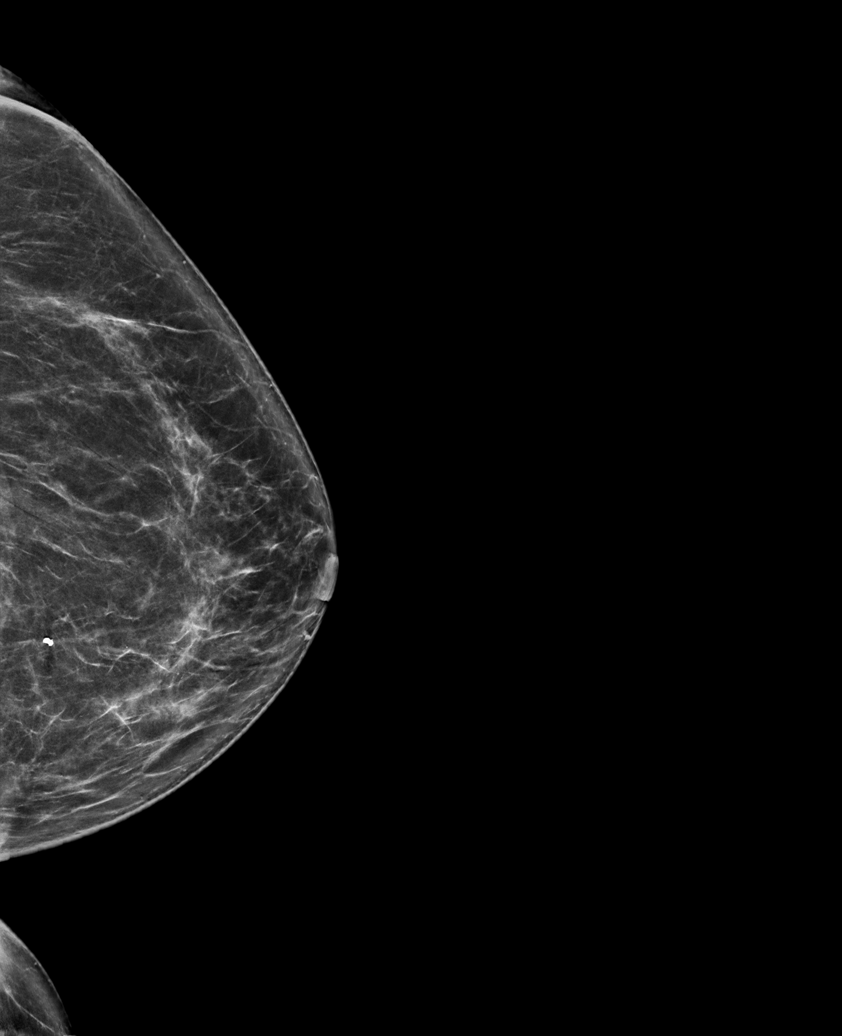

[L MLO synth-2D (2 of 2)]
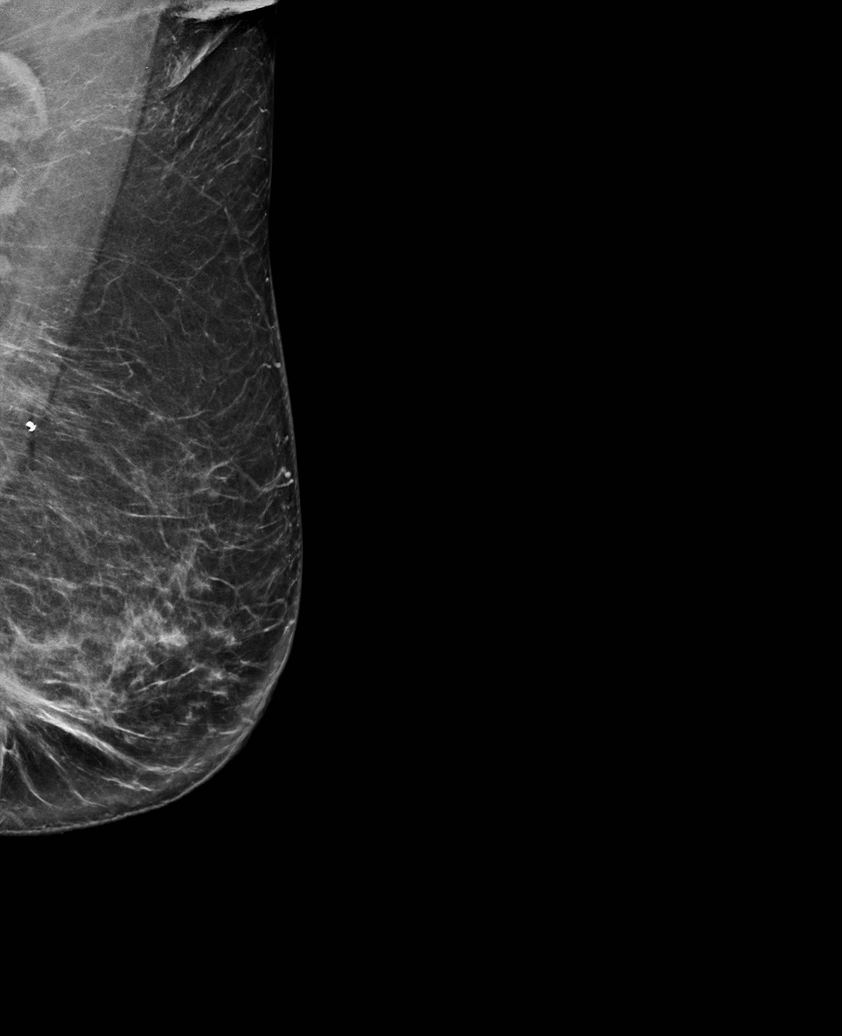

[L MLO tomo · tomo slice 37/72.0]
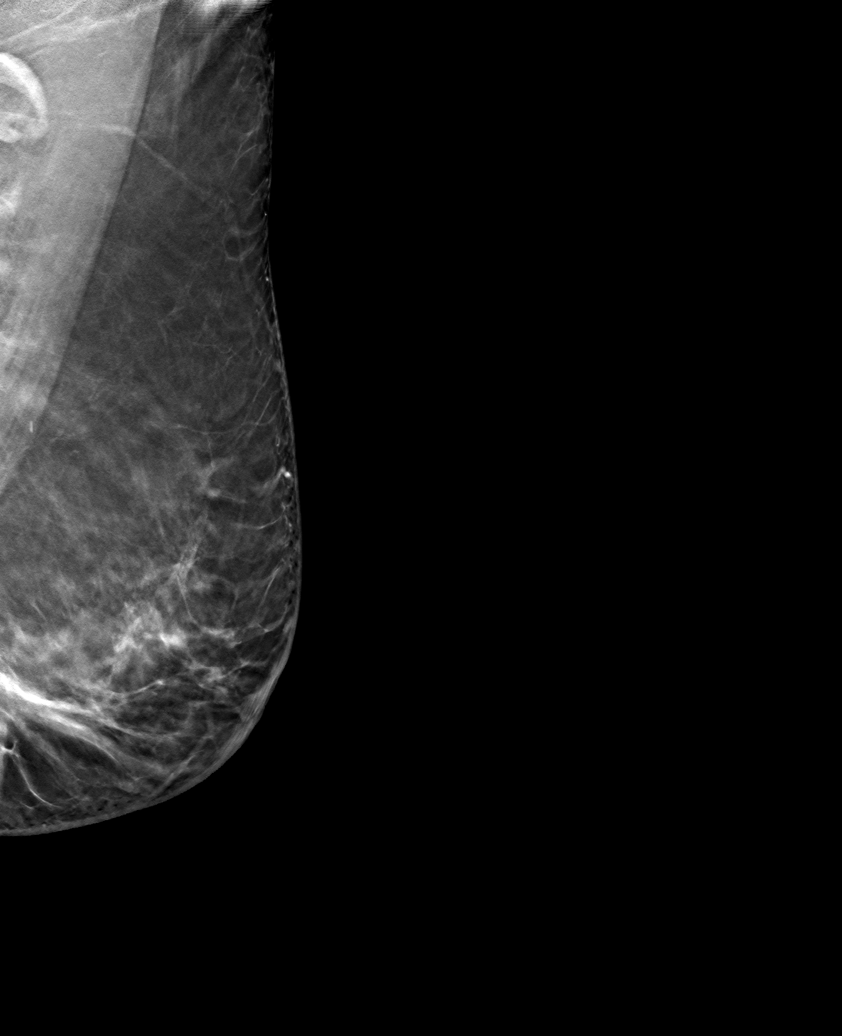

[6 of 30 positions shown; findings below may reference images not displayed]

ACR Breast Density Category b: There are scattered areas of
fibroglandular density.
FINDINGS: There are no findings suspicious for malignancy.
IMPRESSION: No mammographic evidence of malignancy. A result letter of this
screening mammogram will be mailed directly to the patient.

RECOMMENDATION:
Screening mammogram in one year. (Code:51-O-LD2)

BI-RADS CATEGORY  1: Negative.

## 2023-10-14 ENCOUNTER — Other Ambulatory Visit: Payer: Self-pay | Admitting: Internal Medicine

## 2023-10-14 DIAGNOSIS — Z1231 Encounter for screening mammogram for malignant neoplasm of breast: Secondary | ICD-10-CM

## 2024-02-02 ENCOUNTER — Ambulatory Visit
Admission: RE | Admit: 2024-02-02 | Discharge: 2024-02-02 | Disposition: A | Discharge status: Home / Self Care | Source: Ambulatory Visit | Attending: Internal Medicine | Admitting: Internal Medicine

## 2024-02-02 DIAGNOSIS — Z1231 Encounter for screening mammogram for malignant neoplasm of breast: Secondary | ICD-10-CM | POA: Insufficient documentation
